# Patient Record
Sex: Female | Born: 1980 | Race: White | Hispanic: No | Marital: Single | Smoking: Former smoker
Health system: Southern US, Community
[De-identification: ages and names within clinical notes are randomized; demographics above are authoritative.]

## PROBLEM LIST (undated history)

## (undated) DIAGNOSIS — R635 Abnormal weight gain: Secondary | ICD-10-CM

## (undated) DIAGNOSIS — K429 Umbilical hernia without obstruction or gangrene: Secondary | ICD-10-CM

## (undated) DIAGNOSIS — N898 Other specified noninflammatory disorders of vagina: Secondary | ICD-10-CM

## (undated) DIAGNOSIS — R5383 Other fatigue: Secondary | ICD-10-CM

## (undated) DIAGNOSIS — Z975 Presence of (intrauterine) contraceptive device: Secondary | ICD-10-CM

## (undated) HISTORY — DX: Abnormal weight gain: R63.5

## (undated) HISTORY — DX: Umbilical hernia without obstruction or gangrene: K42.9

## (undated) HISTORY — DX: Presence of (intrauterine) contraceptive device: Z97.5

## (undated) HISTORY — DX: Other fatigue: R53.83

## (undated) HISTORY — DX: Other specified noninflammatory disorders of vagina: N89.8

---

## 2003-09-11 ENCOUNTER — Emergency Department (HOSPITAL_COMMUNITY): Admission: EM | Admit: 2003-09-11 | Discharge: 2003-09-11 | Payer: Self-pay | Admitting: Emergency Medicine

## 2004-03-27 ENCOUNTER — Inpatient Hospital Stay (HOSPITAL_COMMUNITY): Admission: AD | Admit: 2004-03-27 | Discharge: 2004-03-30 | Payer: Self-pay | Admitting: Obstetrics & Gynecology

## 2004-05-10 ENCOUNTER — Encounter (HOSPITAL_COMMUNITY): Admission: RE | Admit: 2004-05-10 | Discharge: 2004-06-09 | Payer: Self-pay | Admitting: Orthopedic Surgery

## 2004-11-30 ENCOUNTER — Other Ambulatory Visit: Admission: RE | Admit: 2004-11-30 | Discharge: 2004-11-30 | Payer: Self-pay | Admitting: Obstetrics and Gynecology

## 2004-12-30 ENCOUNTER — Other Ambulatory Visit: Admission: RE | Admit: 2004-12-30 | Discharge: 2004-12-30 | Payer: Self-pay | Admitting: Obstetrics and Gynecology

## 2005-04-21 ENCOUNTER — Other Ambulatory Visit: Admission: RE | Admit: 2005-04-21 | Discharge: 2005-04-21 | Payer: Self-pay | Admitting: Obstetrics and Gynecology

## 2006-08-17 ENCOUNTER — Ambulatory Visit (HOSPITAL_COMMUNITY): Admission: RE | Admit: 2006-08-17 | Discharge: 2006-08-17 | Payer: Self-pay | Admitting: Obstetrics and Gynecology

## 2006-11-26 ENCOUNTER — Encounter (HOSPITAL_COMMUNITY): Admission: RE | Admit: 2006-11-26 | Discharge: 2006-12-26 | Payer: Self-pay | Admitting: Pediatrics

## 2007-11-27 ENCOUNTER — Other Ambulatory Visit: Admission: RE | Admit: 2007-11-27 | Discharge: 2007-11-27 | Payer: Self-pay | Admitting: Obstetrics and Gynecology

## 2008-01-04 IMAGING — CR DG LUMBAR SPINE COMPLETE 4+V
5 series · 5 of 5 positions shown · non-contrast
Comparison: none

HISTORY: Low back strain, low back pain

LUMBAR SPINE 4 VIEWS:
Five lumbar vertebra.
Vertebral body and disc space heights maintained.
No fracture or subluxation.
SI joints symmetric.
No spondylolysis or bone destruction.
IUD noted in pelvis.
Very minimal levoconvex scoliosis lumbar spine, question positional.

[view not recorded (1 of 5)]
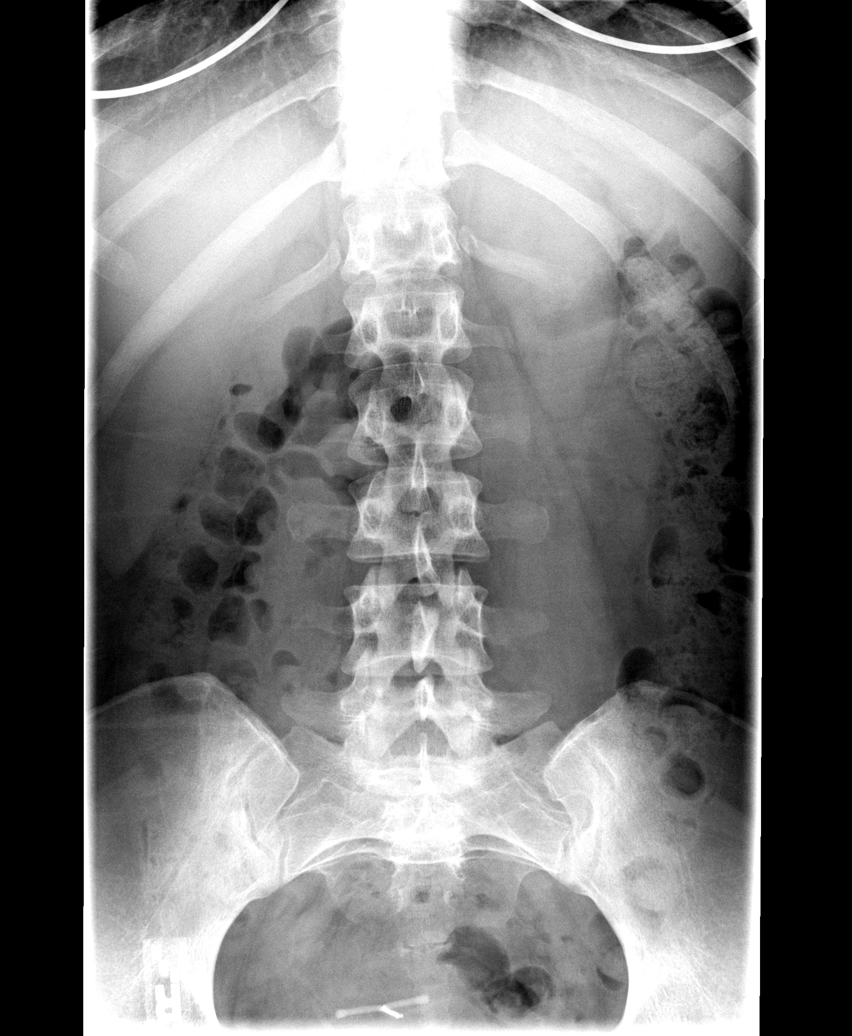

[view not recorded (2 of 5)]
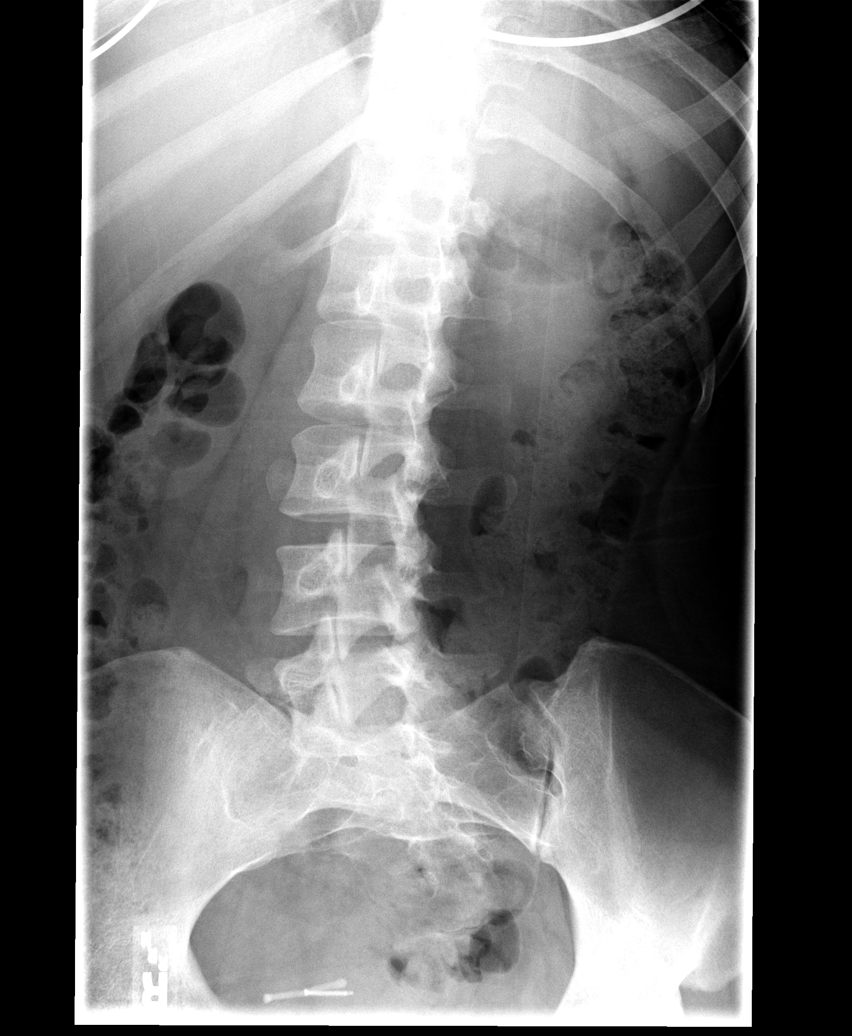

[view not recorded (3 of 5)]
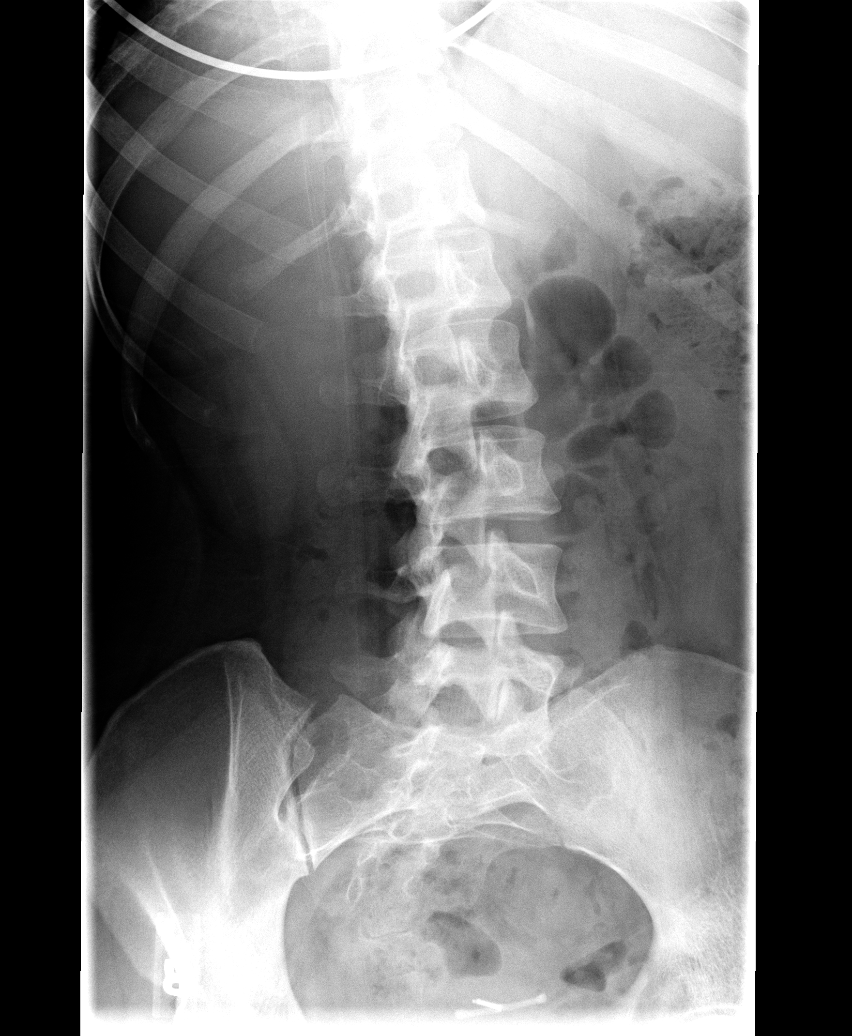

[view not recorded (4 of 5)]
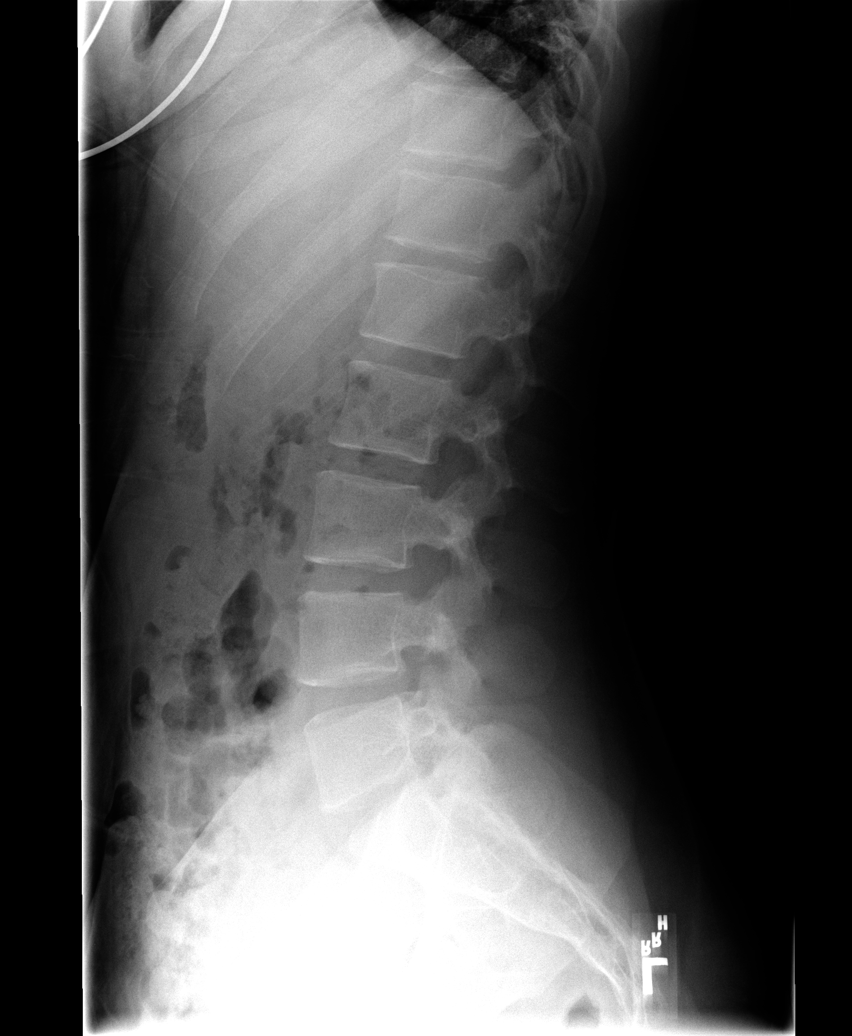

[view not recorded (5 of 5)]
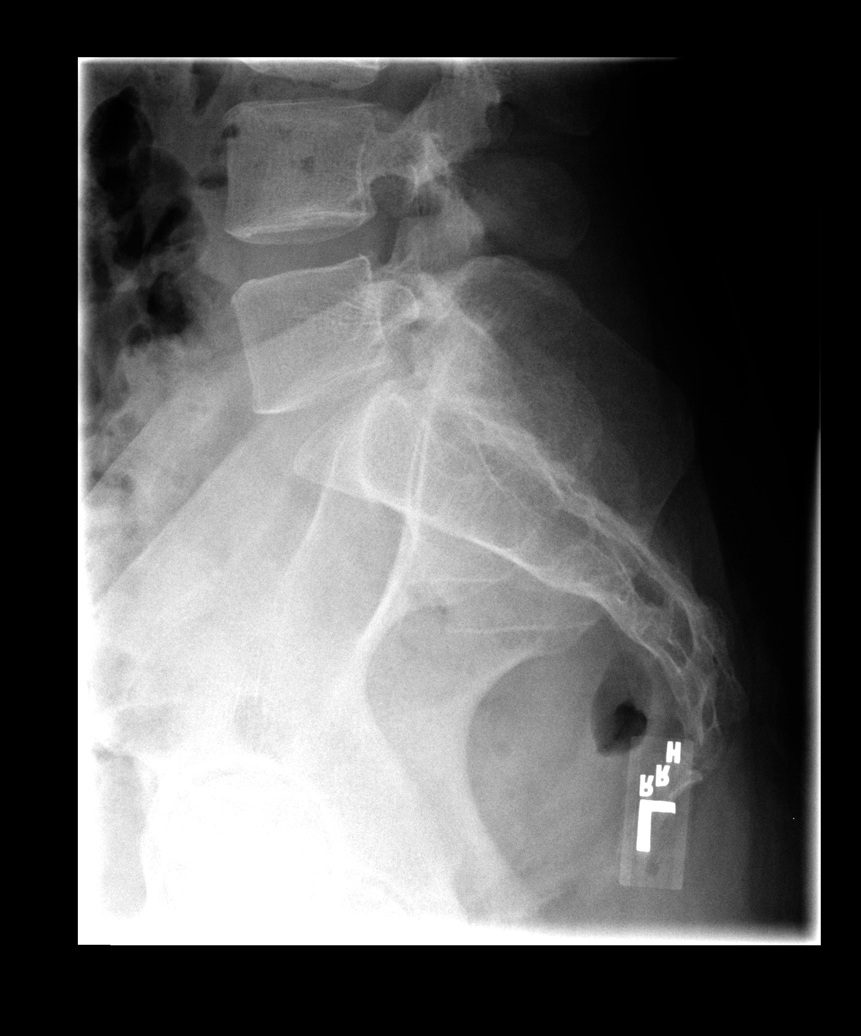

[5 of 5 positions shown; findings below may reference images not displayed]

IMPRESSION: No acute abnormalities.

## 2010-09-26 ENCOUNTER — Other Ambulatory Visit: Admission: RE | Admit: 2010-09-26 | Discharge: 2010-09-26 | Payer: Self-pay | Admitting: Obstetrics and Gynecology
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
## 2011-03-24 NOTE — Op Note (Signed)
NAME:  Betty Phillips, Betty Phillips                           ACCOUNT NO.:  [id number]   MEDICAL RECORD NO.:  [id number]                   PATIENT TYPE:  INP   LOCATION:  A417                                 FACILITY:  [facility]   PHYSICIAN:  Lazaro Arms, M.D.                DATE OF BIRTH:  03/02/1981   DATE OF PROCEDURE:  03/28/2004  DATE OF DISCHARGE:                                 OPERATIVE REPORT   PROCEDURE:  Epidural placement.   INDICATIONS FOR PROCEDURE:  The patient is a 30 year old gravida 1 who is 4  to 5 cm dilated and requesting an epidural.  She has received one dose of  Nubain.   DESCRIPTION OF PROCEDURE:  She was placed in the sitting position.  The L2-  L3 interspace was identified.  Betadine prep was used, and the area was  field draped.  Lidocaine 1% was injected as a local anesthetic.  A 17-gauge  Tuohy needle was placed with one pass into the epidural space using the loss  of resistance technique without difficulty.  Bupivacaine 0.125% plain, 10  cc, was given as a test dose without ill effects.  An additional 10 cc was  given to dose up the epidural.  It was taped down 5 cm into the epidural  space, and 0.125% bupivacaine and 2 mcg per cc of Fentanyl was started at 12  cc an hour.  The patient was receiving comfort.  The blood pressure was  stable, and the fetal heart rate tracing was stable.      ___________________________________________                                            Lazaro Arms, M.D.   LHE/MEDQ  D:  03/28/2004  T:  03/28/2004  Job:  [id number]

## 2011-03-24 NOTE — Op Note (Signed)
NAME:  SILVER, ACHEY                           ACCOUNT NO.:  [id number]   MEDICAL RECORD NO.:  [id number]                   PATIENT TYPE:  INP   LOCATION:  A417                                 FACILITY:  [facility]   PHYSICIAN:  Tilda Burrow, M.D.              DATE OF BIRTH:  27-Dec-1980   DATE OF PROCEDURE:  03/28/2004  DATE OF DISCHARGE:                                 OPERATIVE REPORT   OPERATION PERFORMED:  Vacuum assisted vaginal delivery.   LABOR SUMMARY AND DELIVERY NOTE:  Ms. Templin progressed slowly through the  day, progressing under epidural analgesia with excellent analgesic effect to  completely dilated.  She initially did not have a significant urge to push  and was allowed to epidural slide.  At 5:30 p.m. she began to push and  pushed for approximately 45 minutes.  At this time she had reached outlet  position with the head clearly visible between contractions through the  introitus without effort.  The vertex was __________ occiput anterior  position.  The patient was no longer making progress as the vertex impacted  below the symphysis pubis.  Vacuum assistance was offered, explained to  patient and family's satisfaction, and then utilized to attempt vacuum  assistance through one contraction with some progress made in positioning  the vertex.  She pushed spontaneously for a single contraction and made only  a slight bit more progress.  Vacuum assistance was then used for the third  contraction and at this time we were able to bring the baby into a crowning  position. The vacuum was removed and the patient pushed the baby the final  pushes without vacuum assistance and easily delivered the vertex.  The  perineum could be slipped over the chin in what would appear to be intact  condition.  The head did not descend significantly.  There was a mild  shoulder dystocia with the infant's right shoulder impacted beneath the  symphysis pubis.  The legs were already in McRoberts  position as this was  the position she had pushed.  The bladder had been emptied prior to the  procedure of course.  We rotated the baby with corkscrew maneuver and were  able by use of the delivering physician's index finger in the left axilla,  rotating the left shoulder to the anterior position whereupon the left  shoulder could be delivered completely and the left arm released.  The rest  of the body then delivered past the introitus with a very thick bodied  infant delivered.  The infant was a 9 pound, 6.6 ounce female infant, Apgars 9  and 9.  Bulb suction was performed once the baby was entirely expelled and  then the infant placed on the maternal abdomen after responding to tactile  stimulation.  The infant then went to the warmer after cord clamping and  cutting.  The perineum experienced a deep second degree  laceration, likely  with the delivery of shoulders and chest.  The cord blood gases were  obtained and are located in the chart elsewhere.  The placenta delivered  intact [facility] presentation with 350 mL estimated blood loss.  The  analgesic effect was quite good and so episiotomy repair was done with only  a small amount of local anesthesia, the perineum reapproximated nicely.  There were some irregular edges that required trimming of the severely  injured skin, ecchymotic injured skin.  There was a small gap left on the  patient's left side at the left side of the posterior fourchette which was  denuded completely and will require re-epithelization.   There was a small skin tag on the patient's left gluteal crease which she  requested be removed and it was then removed by local anesthesia, grasped,  crushing with Allis clamp and then trimming.  Hemostasis was satisfactory.  The patient then was placed in the sitting position.  Epidural catheter  removed and tip visualized as intact with patient tolerating the procedure  well with three support persons.       ___________________________________________                                            Tilda Burrow, M.D.   JVF/MEDQ  D:  03/28/2004  T:  03/30/2004  Job:  [id number]   cc:   Francoise Schaumann. Halm, D.O.  [address]  [place]  [place] [address]  Fax: [phone]

## 2011-10-13 ENCOUNTER — Other Ambulatory Visit (HOSPITAL_COMMUNITY)
Admission: RE | Admit: 2011-10-13 | Discharge: 2011-10-13 | Disposition: A | Discharge status: Home / Self Care | Payer: Medicaid Other | Source: Ambulatory Visit | Attending: Obstetrics and Gynecology | Admitting: Obstetrics and Gynecology

## 2011-10-13 DIAGNOSIS — Z01419 Encounter for gynecological examination (general) (routine) without abnormal findings: Secondary | ICD-10-CM | POA: Insufficient documentation

## 2012-11-28 ENCOUNTER — Other Ambulatory Visit: Payer: Self-pay | Admitting: Adult Health

## 2012-11-28 ENCOUNTER — Other Ambulatory Visit (HOSPITAL_COMMUNITY)
Admission: RE | Admit: 2012-11-28 | Discharge: 2012-11-28 | Disposition: A | Discharge status: Home / Self Care | Payer: Medicaid Other | Source: Ambulatory Visit | Attending: Obstetrics and Gynecology | Admitting: Obstetrics and Gynecology

## 2012-11-28 DIAGNOSIS — Z1151 Encounter for screening for human papillomavirus (HPV): Secondary | ICD-10-CM | POA: Insufficient documentation

## 2012-11-28 DIAGNOSIS — Z01419 Encounter for gynecological examination (general) (routine) without abnormal findings: Secondary | ICD-10-CM | POA: Insufficient documentation

## 2013-05-15 ENCOUNTER — Ambulatory Visit: Payer: Self-pay | Admitting: Advanced Practice Midwife

## 2013-05-27 ENCOUNTER — Ambulatory Visit: Payer: Self-pay | Admitting: Advanced Practice Midwife

## 2013-06-25 ENCOUNTER — Ambulatory Visit: Payer: Self-pay | Admitting: Advanced Practice Midwife

## 2013-07-02 ENCOUNTER — Ambulatory Visit: Payer: Self-pay | Admitting: Advanced Practice Midwife

## 2013-07-10 ENCOUNTER — Encounter: Payer: Self-pay | Admitting: Advanced Practice Midwife

## 2013-07-10 ENCOUNTER — Ambulatory Visit (INDEPENDENT_AMBULATORY_CARE_PROVIDER_SITE_OTHER): Payer: Medicaid Other | Admitting: Advanced Practice Midwife

## 2013-07-10 VITALS — BP 100/52 | Ht 66.0 in | Wt 204.0 lb

## 2013-07-10 DIAGNOSIS — Z3202 Encounter for pregnancy test, result negative: Secondary | ICD-10-CM

## 2013-07-10 DIAGNOSIS — Z30432 Encounter for removal of intrauterine contraceptive device: Secondary | ICD-10-CM

## 2013-07-10 DIAGNOSIS — Z30430 Encounter for insertion of intrauterine contraceptive device: Secondary | ICD-10-CM

## 2013-07-10 LAB — POCT URINE PREGNANCY: Preg Test, Ur: NEGATIVE

## 2013-07-10 NOTE — Progress Notes (Signed)
Betty Phillips is a 32 y.o. year old Caucasian female Gravida 4 Para 4  who presents for removal and replacement of a Mirena IUD.  It has been 5 years since her previous IUD placement.  The risks and benefits of the method and placement have been thouroughly reviewed with the patient and all questions were answered.  Specifically the patient is aware of failure rate of 11/998, expulsion of the IUD and of possible perforation.  The patient is aware of irregular bleeding due to the method and understands the incidence of irregular bleeding diminishes with time.  Time out was performed.  A Graves speculum was placed.  The cervix was prepped using Betadine. The strings were found to be  visible.   They were grasped and the Mirena was easily removed. The cervix was then grasped with a tenaculum and the uterus was sounded to 8 cm. The IUD was inserted to 8 cm.  It was pulled back 1 cm and the IUD was disengaged.  The strings were trimmed to 3 cm.  Sonogram was performed and the proper placement of the IUD was verified.  The patient was instructed on signs and symptoms of infection and to check for the strings after each menses or each month.  The patient is to refrain from intercourse for 3 days.   Procedures performed by Baron Sane, PA-S under my supervision.

## 2013-08-07 ENCOUNTER — Encounter: Payer: Self-pay | Admitting: Advanced Practice Midwife

## 2013-08-07 ENCOUNTER — Ambulatory Visit (INDEPENDENT_AMBULATORY_CARE_PROVIDER_SITE_OTHER): Payer: Medicaid Other | Admitting: Advanced Practice Midwife

## 2013-08-07 ENCOUNTER — Ambulatory Visit: Payer: Medicaid Other | Admitting: Advanced Practice Midwife

## 2013-08-07 VITALS — BP 120/70 | Ht 66.0 in | Wt 214.0 lb

## 2013-08-07 DIAGNOSIS — Z30431 Encounter for routine checking of intrauterine contraceptive device: Secondary | ICD-10-CM

## 2013-08-07 DIAGNOSIS — B9689 Other specified bacterial agents as the cause of diseases classified elsewhere: Secondary | ICD-10-CM

## 2013-08-07 DIAGNOSIS — A499 Bacterial infection, unspecified: Secondary | ICD-10-CM

## 2013-08-07 DIAGNOSIS — N760 Acute vaginitis: Secondary | ICD-10-CM

## 2013-08-07 MED ORDER — METRONIDAZOLE 500 MG PO TABS: 500.0000 mg | ORAL_TABLET | Freq: Two times a day (BID) | ORAL | Status: DC

## 2013-08-07 NOTE — Progress Notes (Signed)
Betty Phillips 32 y.o. Filed Vitals:   08/07/13 1356  BP: 120/70   Had Mirena IUD inserted 4 weeks ago.  Has not been sexually active since June when she left her husband. 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  Only c/o increased vaginal discharge.   History reviewed. No pertinent past medical history. Ms. Munsch does not currently have medications on file.  PE:  Thin white vaginal discharge with amine odor.  Wet prep + clue cells, no WBC  Trich or yeast. Strings visible and tucked nicely behind cx  A/P:  IUD check BV:   Meds ordered this encounter  Medications  . metroNIDAZOLE (FLAGYL) 500 MG tablet    Sig: Take 1 tablet (500 mg total) by mouth 2 (two) times daily.    Dispense:  14 tablet    Refill:  0    Order Specific Question:  Supervising Provider    Answer:  Jaynie Collins A [[id number]]

## 2013-08-27 ENCOUNTER — Ambulatory Visit: Payer: Medicaid Other | Admitting: Advanced Practice Midwife

## 2013-10-08 ENCOUNTER — Telehealth: Payer: Self-pay | Admitting: *Deleted

## 2013-10-08 NOTE — Telephone Encounter (Signed)
Spoke with pt. Was seen in Oct by Drenda Freeze and was treated for BV with Flagyl. Pt finished med. No culture was sent out. Pt had no further questions. JSY

## 2013-12-03 ENCOUNTER — Other Ambulatory Visit: Payer: Medicaid Other | Admitting: Adult Health

## 2013-12-09 ENCOUNTER — Encounter (INDEPENDENT_AMBULATORY_CARE_PROVIDER_SITE_OTHER): Payer: Self-pay

## 2013-12-09 ENCOUNTER — Encounter: Payer: Self-pay | Admitting: Adult Health

## 2013-12-09 ENCOUNTER — Ambulatory Visit (INDEPENDENT_AMBULATORY_CARE_PROVIDER_SITE_OTHER): Payer: Medicaid Other | Admitting: Adult Health

## 2013-12-09 VITALS — BP 104/70 | HR 72 | Ht 66.0 in | Wt 214.0 lb

## 2013-12-09 DIAGNOSIS — R5383 Other fatigue: Secondary | ICD-10-CM | POA: Insufficient documentation

## 2013-12-09 DIAGNOSIS — N898 Other specified noninflammatory disorders of vagina: Secondary | ICD-10-CM

## 2013-12-09 DIAGNOSIS — Z01419 Encounter for gynecological examination (general) (routine) without abnormal findings: Secondary | ICD-10-CM

## 2013-12-09 DIAGNOSIS — R635 Abnormal weight gain: Secondary | ICD-10-CM

## 2013-12-09 DIAGNOSIS — Z975 Presence of (intrauterine) contraceptive device: Secondary | ICD-10-CM

## 2013-12-09 DIAGNOSIS — Z0000 Encounter for general adult medical examination without abnormal findings: Secondary | ICD-10-CM

## 2013-12-09 DIAGNOSIS — K429 Umbilical hernia without obstruction or gangrene: Secondary | ICD-10-CM

## 2013-12-09 HISTORY — DX: Other specified noninflammatory disorders of vagina: N89.8

## 2013-12-09 HISTORY — DX: Other fatigue: R53.83

## 2013-12-09 HISTORY — DX: Umbilical hernia without obstruction or gangrene: K42.9

## 2013-12-09 HISTORY — DX: Abnormal weight gain: R63.5

## 2013-12-09 NOTE — Progress Notes (Signed)
Patient ID: Betty NeerJean A Hodkinson, female   DOB: Nov 14, 1980, 33 y.o.   MRN: [id number] History of Present Illness: Carney BernJean is a  33 year old white female, separated, in new relationship, in for a physical, she had a normal pap with negative HPV 11/28/12.She complains of fatigue, weight gain and dry brittle hair and nails and some constipation, and vaginal discharge at times.Has IUD and is working 3 rd shift.   Current Medications, Allergies, Past Medical History, Past Surgical History, Family History and Social History were reviewed in [facility] electronic medical record.     Review of Systems: Patient denies any headaches, blurred vision, shortness of breath, chest pain, abdominal pain, problems with urination, or intercourse.No joint pian or mood swings.See HPI for +.    Physical Exam:BP 104/70  Pulse 72  Ht 5\' 6"  (1.676 m)  Wt 214 lb (97.07 kg)  BMI 34.56 kg/m2 General:  Well developed, well nourished, no acute distress Skin:  Warm and dry Neck:  Midline trachea, normal thyroid Lungs; Clear to auscultation bilaterally Breast:  No dominant palpable mass, retraction, or nipple discharge Cardiovascular: Regular rate and rhythm Abdomen:  Soft, non tender, no hepatosplenomegaly, has small umbilical hernia Pelvic:  External genitalia is normal in appearance.  The vagina is normal in appearance, some white discharge, no odor. The cervix is bulbous. +IUD strings. Uterus is felt to be normal size, shape, and contour.  No adnexal masses or tenderness noted. Extremities:  No swelling or varicosities noted Psych:  Alert and cooperative, seems happy   Impression:  Yearly gyn exam no pap Vaginal discharge Weight gain Fatigue IUD in place Small umbilical hernia  Plan: Check CBC,CMP,TSH and lipids Try rephresh Physical in 1 year Call in 48 hours fir labs Review handout on weight loss, eat fresh fruit and veggies and more protein,decrease carbs, try to eat more often

## 2013-12-09 NOTE — Patient Instructions (Signed)
Physical in 1 year Call for labs 48 labs Try rephresh prn Calorie Counting Diet A calorie counting diet requires you to eat the number of calories that are right for you in a day. Calories are the measurement of how much energy you get from the food you eat. Eating the right amount of calories is important for staying at a healthy weight. If you eat too many calories, your body will store them as fat and you may gain weight. If you eat too few calories, you may lose weight. Counting the number of calories you eat during a day will help you know if you are eating the right amount. A Registered Dietitian can determine how many calories you need in a day. The amount of calories needed varies from person to person. If your goal is to lose weight, you will need to eat fewer calories. Losing weight can benefit you if you are overweight or have health problems such as heart disease, high blood pressure, or diabetes. If your goal is to gain weight, you will need to eat more calories. Gaining weight may be necessary if you have a certain health problem that causes your body to need more energy. TIPS Whether you are increasing or decreasing the number of calories you eat during a day, it may be hard to get used to changes in what you eat and drink. The following are tips to help you keep track of the number of calories you eat.  Measure foods at home with measuring cups. This helps you know the amount of food and number of calories you are eating.  Restaurants often serve food in amounts that are larger than 1 serving. While eating out, estimate how many servings of a food you are given. For example, a serving of cooked rice is  cup or about the size of half of a fist. Knowing serving sizes will help you be aware of how much food you are eating at restaurants.  Ask for smaller portion sizes or child-size portions at restaurants.  Plan to eat half of a meal at a restaurant. Take the rest home or share the other  half with a friend.  Read the Nutrition Facts panel on food labels for calorie content and serving size. You can find out how many servings are in a package, the size of a serving, and the number of calories each serving has.  For example, a package might contain 3 cookies. The Nutrition Facts panel on that package says that 1 serving is 1 cookie. Below that, it will say there are 3 servings in the container. The calories section of the Nutrition Facts label says there are 90 calories. This means there are 90 calories in 1 cookie (1 serving). If you eat 1 cookie you have eaten 90 calories. If you eat all 3 cookies, you have eaten 270 calories (3 servings x 90 calories = 270 calories). The list below tells you how big or small some common portion sizes are.  1 oz.........4 stacked dice.  3 oz........Marland Kitchen.Deck of cards.  1 tsp.......Marland Kitchen.Tip of little finger.  1 tbs......Marland Kitchen.Marland Kitchen.Thumb.  2 tbs.......Marland Kitchen.Golf ball.   cup......Marland Kitchen.Half of a fist.  1 cup.......Marland Kitchen.A fist. KEEP A FOOD LOG Write down every food item you eat, the amount you eat, and the number of calories in each food you eat during the day. At the end of the day, you can add up the total number of calories you have eaten. It may help to keep a list  like the one below. Find out the calorie information by reading the Nutrition Facts panel on food labels. Breakfast  Bran cereal (1 cup, 110 calories).  Fat-free milk ( cup, 45 calories). Snack  Apple (1 medium, 80 calories). Lunch  Spinach (1 cup, 20 calories).  Tomato ( medium, 20 calories).  Chicken breast strips (3 oz, 165 calories).  Shredded cheddar cheese ( cup, 110 calories).  Light [place] dressing (2 tbs, 60 calories).  Whole-wheat bread (1 slice, 80 calories).  Tub margarine (1 tsp, 35 calories).  Vegetable soup (1 cup, 160 calories). Dinner  Pork chop (3 oz, 190 calories).  Brown rice (1 cup, 215 calories).  Steamed broccoli ( cup, 20 calories).  Strawberries (1   cup, 65 calories).  Whipped cream (1 tbs, 50 calories). Daily Calorie Total: 1425 Document Released: 10/23/2005 Document Revised: 01/15/2012 Document Reviewed: 04/19/2007 [facility] Patient Information 2014 [place], [place]. Eat protein and fresh fruit and vaggies, very little carbs

## 2013-12-10 LAB — COMPREHENSIVE METABOLIC PANEL
ALBUMIN: 3.9 g/dL (ref 3.5–5.2)
ALT: 15 U/L (ref 0–35)
AST: 18 U/L (ref 0–37)
Alkaline Phosphatase: 80 U/L (ref 39–117)
BILIRUBIN TOTAL: 0.4 mg/dL (ref 0.2–1.2)
BUN: 9 mg/dL (ref 6–23)
CO2: 27 meq/L (ref 19–32)
Calcium: 9 mg/dL (ref 8.4–10.5)
Chloride: 106 mEq/L (ref 96–112)
Creat: 0.92 mg/dL (ref 0.50–1.10)
GLUCOSE: 79 mg/dL (ref 70–99)
POTASSIUM: 4.3 meq/L (ref 3.5–5.3)
SODIUM: 140 meq/L (ref 135–145)
TOTAL PROTEIN: 6.7 g/dL (ref 6.0–8.3)

## 2013-12-10 LAB — CBC
HEMATOCRIT: 41.2 % (ref 36.0–46.0)
HEMOGLOBIN: 13.8 g/dL (ref 12.0–15.0)
MCH: 28.3 pg (ref 26.0–34.0)
MCHC: 33.5 g/dL (ref 30.0–36.0)
MCV: 84.6 fL (ref 78.0–100.0)
Platelets: 330 10*3/uL (ref 150–400)
RBC: 4.87 MIL/uL (ref 3.87–5.11)
RDW: 14.1 % (ref 11.5–15.5)
WBC: 8.8 10*3/uL (ref 4.0–10.5)

## 2013-12-10 LAB — LIPID PANEL
CHOLESTEROL: 134 mg/dL (ref 0–200)
HDL: 37 mg/dL — ABNORMAL LOW (ref >39)
LDL Cholesterol: 69 mg/dL (ref 0–99)
TRIGLYCERIDES: 138 mg/dL (ref <150)
Total CHOL/HDL Ratio: 3.6 Ratio
VLDL: 28 mg/dL (ref 0–40)

## 2013-12-10 LAB — TSH: TSH: 2.022 u[IU]/mL (ref 0.350–4.500)

## 2013-12-11 ENCOUNTER — Telehealth: Payer: Self-pay | Admitting: Adult Health

## 2013-12-11 NOTE — Telephone Encounter (Signed)
Left message labs look good just increase activity to increase HDL

## 2014-01-19 ENCOUNTER — Telehealth: Payer: Self-pay | Admitting: Obstetrics and Gynecology
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
## 2014-01-21 ENCOUNTER — Ambulatory Visit: Payer: Medicaid Other | Admitting: Obstetrics & Gynecology

## 2014-01-22 ENCOUNTER — Ambulatory Visit: Payer: Medicaid Other | Admitting: Adult Health

## 2014-09-07 ENCOUNTER — Encounter: Payer: Self-pay | Admitting: Adult Health

## 2015-04-20 ENCOUNTER — Other Ambulatory Visit: Payer: Medicaid Other | Admitting: Adult Health

## 2015-04-27 ENCOUNTER — Other Ambulatory Visit: Payer: Medicaid Other | Admitting: Adult Health

## 2015-04-27 ENCOUNTER — Encounter: Payer: Self-pay | Admitting: *Deleted

## 2015-09-02 ENCOUNTER — Encounter: Payer: Self-pay | Admitting: Adult Health

## 2015-09-02 ENCOUNTER — Ambulatory Visit (INDEPENDENT_AMBULATORY_CARE_PROVIDER_SITE_OTHER): Payer: PRIVATE HEALTH INSURANCE | Admitting: Adult Health

## 2015-09-02 VITALS — BP 110/60 | HR 76 | Ht 66.0 in | Wt 207.0 lb

## 2015-09-02 DIAGNOSIS — K429 Umbilical hernia without obstruction or gangrene: Secondary | ICD-10-CM

## 2015-09-02 DIAGNOSIS — Z01419 Encounter for gynecological examination (general) (routine) without abnormal findings: Secondary | ICD-10-CM | POA: Diagnosis not present

## 2015-09-02 DIAGNOSIS — Z113 Encounter for screening for infections with a predominantly sexual mode of transmission: Secondary | ICD-10-CM

## 2015-09-02 DIAGNOSIS — Z975 Presence of (intrauterine) contraceptive device: Secondary | ICD-10-CM

## 2015-09-02 HISTORY — DX: Presence of (intrauterine) contraceptive device: Z97.5

## 2015-09-02 NOTE — Patient Instructions (Signed)
Pap and physical in 1 year Mammogram at 42   

## 2015-09-02 NOTE — Progress Notes (Signed)
Patient ID: Betty Phillips, female   DOB: [birth year], 34 y.o.   MRN: [id number] History of Present Illness:  Betty Phillips is a 34 year old white female,single but in good relationship now, in for a well woman gyn exam, she had a normal pap with negative HPV 11/28/12. She requests STD testing.  Current Medications, Allergies, Past Medical History, Past Surgical History, Family History and Social History were reviewed in [facility] electronic medical record.     Review of Systems: Patient denies any headaches, hearing loss, fatigue, blurred vision, shortness of breath, chest pain, abdominal pain, problems with bowel movements, urination, or intercourse. No joint pain or mood swings.Has occasional discharge in panties and odor sometimes.    Physical Exam:BP 110/60 mmHg  Pulse 76  Ht 5\' 6"  (1.676 m)  Wt 207 lb (93.895 kg)  BMI 33.43 kg/m2 General:  Well developed, well nourished, no acute distress Skin:  Warm and dry Neck:  Midline trachea, normal thyroid, good ROM, no lymphadenopathy Lungs; Clear to auscultation bilaterally Breast:  No dominant palpable mass, retraction, or nipple discharge Cardiovascular: Regular rate and rhythm Abdomen:  Soft, non tender, no hepatosplenomegaly,has small reducible umbilical hernia Pelvic:  External genitalia is normal in appearance, no lesions.  The vagina is normal in appearance. Urethra has no lesions or masses. The cervix is bulbous. + IUD strings seen,GC/CHL obtained. Uterus is felt to be normal size, shape, and contour.  No adnexal masses or tenderness noted.Bladder is non tender, no masses felt. Extremities/musculoskeletal:  No swelling or varicosities noted, no clubbing or cyanosis Psych:  No mood changes, alert and cooperative,seems happy   Impression: Well woman gyn exam no pap Umbilical hernia  STD screening   Plan: Check CBC,CMP,TSH and lipids,HIV,RPR and HSV 2,will talk when labs back  GC/CHL sent Pap and physical in 1 year Mammogram at  42 If has pain in umbilical hernia go to ER

## 2015-09-03 LAB — CBC
Hematocrit: 43 % (ref 34.0–46.6)
Hemoglobin: 14.4 g/dL (ref 11.1–15.9)
MCH: 28.5 pg (ref 26.6–33.0)
MCHC: 33.5 g/dL (ref 31.5–35.7)
MCV: 85 fL (ref 79–97)
PLATELETS: 279 10*3/uL (ref 150–379)
RBC: 5.05 x10E6/uL (ref 3.77–5.28)
RDW: 13.9 % (ref 12.3–15.4)
WBC: 9.6 10*3/uL (ref 3.4–10.8)

## 2015-09-03 LAB — LIPID PANEL
CHOL/HDL RATIO: 4.4 ratio (ref 0.0–4.4)
CHOLESTEROL TOTAL: 169 mg/dL (ref 100–199)
HDL: 38 mg/dL — ABNORMAL LOW (ref >39)
LDL CALC: 91 mg/dL (ref 0–99)
Triglycerides: 200 mg/dL — ABNORMAL HIGH (ref 0–149)
VLDL CHOLESTEROL CAL: 40 mg/dL (ref 5–40)

## 2015-09-03 LAB — COMPREHENSIVE METABOLIC PANEL
A/G RATIO: 1.5 (ref 1.1–2.5)
ALK PHOS: 82 IU/L (ref 39–117)
ALT: 21 IU/L (ref 0–32)
AST: 28 IU/L (ref 0–40)
Albumin: 4.5 g/dL (ref 3.5–5.5)
BILIRUBIN TOTAL: 0.3 mg/dL (ref 0.0–1.2)
BUN / CREAT RATIO: 11 (ref 8–20)
BUN: 10 mg/dL (ref 6–20)
CALCIUM: 9.6 mg/dL (ref 8.7–10.2)
CO2: 24 mmol/L (ref 18–29)
CREATININE: 0.94 mg/dL (ref 0.57–1.00)
Chloride: 100 mmol/L (ref 97–106)
GFR, EST AFRICAN AMERICAN: 93 mL/min/{1.73_m2} (ref >59)
GFR, EST NON AFRICAN AMERICAN: 81 mL/min/{1.73_m2} (ref >59)
GLOBULIN, TOTAL: 3 g/dL (ref 1.5–4.5)
Glucose: 85 mg/dL (ref 65–99)
Potassium: 4.5 mmol/L (ref 3.5–5.2)
SODIUM: 138 mmol/L (ref 136–144)
Total Protein: 7.5 g/dL (ref 6.0–8.5)

## 2015-09-03 LAB — RPR: RPR Ser Ql: NONREACTIVE

## 2015-09-03 LAB — HSV 2 ANTIBODY, IGG

## 2015-09-03 LAB — TSH: TSH: 1.93 u[IU]/mL (ref 0.450–4.500)

## 2015-09-03 LAB — HIV ANTIBODY (ROUTINE TESTING W REFLEX): HIV SCREEN 4TH GENERATION: NONREACTIVE

## 2015-09-05 LAB — GC/CHLAMYDIA PROBE AMP
CHLAMYDIA, DNA PROBE: NEGATIVE
Neisseria gonorrhoeae by PCR: NEGATIVE

## 2015-09-07 ENCOUNTER — Telehealth: Payer: Self-pay | Admitting: Adult Health

## 2015-09-07 NOTE — Telephone Encounter (Signed)
Pt aware of labs,increase exercise and decrease carbs and fats,recheck 1 year

## 2016-06-07 ENCOUNTER — Ambulatory Visit: Payer: PRIVATE HEALTH INSURANCE | Admitting: Advanced Practice Midwife

## 2016-06-08 ENCOUNTER — Ambulatory Visit (INDEPENDENT_AMBULATORY_CARE_PROVIDER_SITE_OTHER): Payer: PRIVATE HEALTH INSURANCE | Admitting: Advanced Practice Midwife

## 2016-06-08 ENCOUNTER — Encounter: Payer: Self-pay | Admitting: Advanced Practice Midwife

## 2016-06-08 VITALS — BP 136/88 | HR 64 | Ht 66.0 in | Wt 224.0 lb

## 2016-06-08 DIAGNOSIS — Z30432 Encounter for removal of intrauterine contraceptive device: Secondary | ICD-10-CM | POA: Diagnosis not present

## 2016-06-08 NOTE — Progress Notes (Signed)
  Betty Phillips 35 y.o.  Vitals:   06/08/16 1413  BP: 136/88  Pulse: 64   Past Medical History:  Diagnosis Date  . Fatigue 12/09/2013  . IUD (intrauterine device) in place 09/02/2015  . Umbilical hernia 12/09/2013  . Vaginal discharge 12/09/2013  . Weight gain 12/09/2013   No past surgical history on file. family history includes Asthma in her daughter; COPD in her mother; Cancer in her maternal grandfather; Cystic fibrosis in her maternal grandmother; Lupus in her maternal grandmother; Rheum arthritis in her maternal grandmother.  Current Outpatient Prescriptions:  .  levonorgestrel (MIRENA) 20 MCG/24HR IUD, 1 each by Intrauterine route once., Disp: , Rfl:     Here for IUD removal.  She had the Mirena IUD placed 3 years ago and would like it removed to have another baby..  A graves speculum was placed, and the strings were visible.  They were grasped with a curved Tresa Endo and the IUD easily removed.  Pt given IUD removal f/u instructions.

## 2016-09-07 ENCOUNTER — Other Ambulatory Visit: Payer: PRIVATE HEALTH INSURANCE | Admitting: Adult Health

## 2016-09-07 ENCOUNTER — Telehealth: Payer: Self-pay | Admitting: *Deleted

## 2016-09-07 NOTE — Telephone Encounter (Signed)
Pt called asking when her last pap was. It was in 2014. I advised she is due a pap. Pt is pregnant and [facility] since it's closer to home. I advised they can do pap. Pt voiced understanding. JSY
